# Patient Record
Sex: Male | Born: 1995 | Race: Black or African American | Hispanic: No | Marital: Single | Smoking: Current every day smoker
Health system: Southern US, Community
[De-identification: ages and names within clinical notes are randomized; demographics above are authoritative.]

## PROBLEM LIST (undated history)

## (undated) ENCOUNTER — Emergency Department (HOSPITAL_BASED_OUTPATIENT_CLINIC_OR_DEPARTMENT_OTHER): Discharge status: Absent without leave

---

## 2018-12-01 ENCOUNTER — Other Ambulatory Visit: Payer: Self-pay

## 2018-12-01 ENCOUNTER — Emergency Department (HOSPITAL_COMMUNITY)
Admission: EM | Admit: 2018-12-01 | Discharge: 2018-12-01 | Disposition: A | Discharge status: Home / Self Care | Payer: Self-pay | Attending: Emergency Medicine | Admitting: Emergency Medicine

## 2018-12-01 ENCOUNTER — Encounter (HOSPITAL_COMMUNITY): Payer: Self-pay | Admitting: Emergency Medicine

## 2018-12-01 DIAGNOSIS — F17200 Nicotine dependence, unspecified, uncomplicated: Secondary | ICD-10-CM | POA: Insufficient documentation

## 2018-12-01 DIAGNOSIS — R300 Dysuria: Secondary | ICD-10-CM | POA: Insufficient documentation

## 2018-12-01 DIAGNOSIS — Z202 Contact with and (suspected) exposure to infections with a predominantly sexual mode of transmission: Secondary | ICD-10-CM | POA: Insufficient documentation

## 2018-12-01 LAB — URINALYSIS, ROUTINE W REFLEX MICROSCOPIC
Bilirubin Urine: NEGATIVE
Glucose, UA: NEGATIVE mg/dL
Hgb urine dipstick: NEGATIVE
Ketones, ur: NEGATIVE mg/dL
Nitrite: NEGATIVE
Protein, ur: NEGATIVE mg/dL
Specific Gravity, Urine: 1.028 (ref 1.005–1.030)
pH: 6 (ref 5.0–8.0)

## 2018-12-01 MED ORDER — LIDOCAINE HCL (PF) 1 % IJ SOLN
INTRAMUSCULAR | Status: AC
  Administered 2018-12-01: 0.9 mL
  Filled 2018-12-01: qty 5

## 2018-12-01 MED ORDER — CEFTRIAXONE SODIUM 250 MG IJ SOLR
250.0000 mg | Freq: Once | INTRAMUSCULAR | Status: AC
  Administered 2018-12-01: 21:00:00 250 mg via INTRAMUSCULAR
  Filled 2018-12-01: qty 250

## 2018-12-01 MED ORDER — AZITHROMYCIN 250 MG PO TABS
1000.0000 mg | ORAL_TABLET | Freq: Once | ORAL | Status: AC
  Administered 2018-12-01: 1000 mg via ORAL
  Filled 2018-12-01: qty 4

## 2018-12-01 NOTE — Discharge Instructions (Signed)
You have cultures for gonorrhea and chlamydia that will return in 24-48 hours.

## 2018-12-01 NOTE — ED Provider Notes (Signed)
[facility] EMERGENCY DEPARTMENT Provider Note   CSN: [id number] Arrival date & time: 12/01/18  1802    History   Chief Complaint Chief Complaint  Patient presents with  . Dysuria  . Exposure to STD    HPI Cristian Rose is a 23 y.o. male.     The history is provided by the patient.  Dysuria  Presenting symptoms: dysuria   Context: after urination   Relieved by:  Nothing Ineffective treatments:  None tried Associated symptoms: no abdominal pain   Risk factors: no STI exposure   Exposure to STD  Pertinent negatives include no abdominal pain.    History reviewed. No pertinent past medical history.  There are no active problems to display for this patient.   History reviewed. No pertinent surgical history.      Home Medications    Prior to Admission medications   Not on File    Family History No family history on file.  Social History Social History   Tobacco Use  . Smoking status: Current Every Day Smoker    Packs/day: 0.50  . Smokeless tobacco: Never Used  Substance Use Topics  . Alcohol use: Yes  . Drug use: Never     Allergies   Patient has no allergy information on record.   Review of Systems Review of Systems  Gastrointestinal: Negative for abdominal pain.  Genitourinary: Positive for dysuria.  All other systems reviewed and are negative.    Physical Exam Updated Vital Signs BP 111/68 (BP Location: Left Arm)   Pulse 60   Temp 98.4 F (36.9 C) (Oral)   Resp 16   Wt 70.3 kg   SpO2 99%   Physical Exam Vitals signs and nursing note reviewed.  Constitutional:      Appearance: He is well-developed.  HENT:     Head: Normocephalic.  Neck:     Musculoskeletal: Normal range of motion.  Pulmonary:     Effort: Pulmonary effort is normal.  Abdominal:     General: There is no distension.  Musculoskeletal: Normal range of motion.  Neurological:     Mental Status: He is alert and oriented to person, place, and  time.      ED Treatments / Results  Labs (all labs ordered are listed, but only abnormal results are displayed) Labs Reviewed  URINALYSIS, ROUTINE W REFLEX MICROSCOPIC - Abnormal; Notable for the following components:      Result Value   Leukocytes,Ua MODERATE (*)    Bacteria, UA RARE (*)    All other components within normal limits  URINE CYTOLOGY ANCILLARY ONLY    EKG None  Radiology No results found.  Procedures Procedures (including critical care time)  Medications Ordered in ED Medications  cefTRIAXone (ROCEPHIN) injection 250 mg (250 mg Intramuscular Given 12/01/18 2122)  azithromycin (ZITHROMAX) tablet 1,000 mg (1,000 mg Oral Given 12/01/18 2123)  lidocaine (PF) (XYLOCAINE) 1 % injection (0.9 mLs  Given 12/01/18 2126)     Initial Impression / Assessment and Plan / ED Course  I have reviewed the triage vital signs and the nursing notes.  Pertinent labs & imaging results that were available during my care of the patient were reviewed by me and considered in my medical decision making (see chart for details).         MDM  GC and ct pending.  Pt request treatment.  Pt given Rocephin and zithromax Final Clinical Impressions(s) / ED Diagnoses   Final diagnoses:  Dysuria  ED Discharge Orders    None    An After Visit Summary was printed and given to the patient.    Osie CheeksSofia, Jahzeel Poythress K, PA-C 12/01/18 2225    Loren RacerYelverton, David, MD 12/06/18 [phone]

## 2018-12-01 NOTE — ED Triage Notes (Signed)
Pt in with c/o burning with urination since this am. Wants to get tested for STD's

## 2018-12-01 NOTE — ED Notes (Signed)
Called [facility] to find out what to do for cytology order. Sending lab rec to 12.

## 2018-12-02 LAB — URINE CYTOLOGY ANCILLARY ONLY
Chlamydia: NEGATIVE
Neisseria Gonorrhea: POSITIVE — AB

## 2019-03-21 ENCOUNTER — Emergency Department (HOSPITAL_COMMUNITY)
Admission: EM | Admit: 2019-03-21 | Discharge: 2019-03-21 | Disposition: A | Discharge status: Home / Self Care | Payer: Medicaid Other | Attending: Emergency Medicine | Admitting: Emergency Medicine

## 2019-03-21 ENCOUNTER — Other Ambulatory Visit: Payer: Self-pay

## 2019-03-21 DIAGNOSIS — Z5321 Procedure and treatment not carried out due to patient leaving prior to being seen by health care provider: Secondary | ICD-10-CM | POA: Insufficient documentation

## 2019-03-21 DIAGNOSIS — M549 Dorsalgia, unspecified: Secondary | ICD-10-CM | POA: Insufficient documentation
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
## 2019-05-23 ENCOUNTER — Encounter (HOSPITAL_COMMUNITY): Payer: Self-pay

## 2019-05-23 ENCOUNTER — Emergency Department (HOSPITAL_COMMUNITY)
Admission: EM | Admit: 2019-05-23 | Discharge: 2019-05-23 | Disposition: A | Discharge status: Home / Self Care | Payer: Medicaid Other | Attending: Emergency Medicine | Admitting: Emergency Medicine

## 2019-05-23 ENCOUNTER — Other Ambulatory Visit: Payer: Self-pay

## 2019-05-23 DIAGNOSIS — R1031 Right lower quadrant pain: Secondary | ICD-10-CM | POA: Insufficient documentation

## 2019-05-23 DIAGNOSIS — Z5321 Procedure and treatment not carried out due to patient leaving prior to being seen by health care provider: Secondary | ICD-10-CM | POA: Insufficient documentation

## 2019-05-23 LAB — CBC
HCT: 47.8 % (ref 39.0–52.0)
Hemoglobin: 15.2 g/dL (ref 13.0–17.0)
MCH: 27 pg (ref 26.0–34.0)
MCHC: 31.8 g/dL (ref 30.0–36.0)
MCV: 84.8 fL (ref 80.0–100.0)
Platelets: 248 10*3/uL (ref 150–400)
RBC: 5.64 MIL/uL (ref 4.22–5.81)
RDW: 13.6 % (ref 11.5–15.5)
WBC: 4.7 10*3/uL (ref 4.0–10.5)
nRBC: 0 % (ref 0.0–0.2)

## 2019-05-23 LAB — COMPREHENSIVE METABOLIC PANEL
ALT: 21 U/L (ref 0–44)
AST: 24 U/L (ref 15–41)
Albumin: 4.3 g/dL (ref 3.5–5.0)
Alkaline Phosphatase: 38 U/L (ref 38–126)
Anion gap: 9 (ref 5–15)
BUN: 12 mg/dL (ref 6–20)
CO2: 28 mmol/L (ref 22–32)
Calcium: 9.4 mg/dL (ref 8.9–10.3)
Chloride: 105 mmol/L (ref 98–111)
Creatinine, Ser: 1.13 mg/dL (ref 0.61–1.24)
GFR calc Af Amer: >60 mL/min (ref >60)
GFR calc non Af Amer: >60 mL/min (ref >60)
Glucose, Bld: 91 mg/dL (ref 70–99)
Potassium: 4.1 mmol/L (ref 3.5–5.1)
Sodium: 142 mmol/L (ref 135–145)
Total Bilirubin: 0.6 mg/dL (ref 0.3–1.2)
Total Protein: 7.2 g/dL (ref 6.5–8.1)

## 2019-05-23 LAB — URINALYSIS, ROUTINE W REFLEX MICROSCOPIC
Bilirubin Urine: NEGATIVE
Glucose, UA: NEGATIVE mg/dL
Hgb urine dipstick: NEGATIVE
Ketones, ur: NEGATIVE mg/dL
Leukocytes,Ua: NEGATIVE
Nitrite: NEGATIVE
Protein, ur: NEGATIVE mg/dL
Specific Gravity, Urine: 1.021 (ref 1.005–1.030)
pH: 6 (ref 5.0–8.0)

## 2019-05-23 LAB — LIPASE, BLOOD: Lipase: 24 U/L (ref 11–51)

## 2019-05-23 NOTE — ED Triage Notes (Signed)
Pt arrives POV for eval of R sided abd/flank pain onset 3 days PTA. Pt reports that his family recently all had their appendix out and he wants to get checked out for same. Denies fever, N/V.

## 2019-05-23 NOTE — ED Notes (Signed)
Pt said he's been "working all day, came here straight from work and is leaving to get some sleep." This RN and Programme researcher, broadcasting/film/video advised against leaving without being seen. Pt said he will return if need be.

## 2019-05-26 ENCOUNTER — Other Ambulatory Visit: Payer: Self-pay

## 2019-05-26 ENCOUNTER — Emergency Department (HOSPITAL_COMMUNITY)
Admission: EM | Admit: 2019-05-26 | Discharge: 2019-05-26 | Disposition: A | Discharge status: Home / Self Care | Payer: Medicaid Other | Attending: Emergency Medicine | Admitting: Emergency Medicine

## 2019-05-26 ENCOUNTER — Encounter (HOSPITAL_COMMUNITY): Payer: Self-pay

## 2019-05-26 DIAGNOSIS — R1031 Right lower quadrant pain: Secondary | ICD-10-CM | POA: Insufficient documentation

## 2019-05-26 DIAGNOSIS — F17210 Nicotine dependence, cigarettes, uncomplicated: Secondary | ICD-10-CM | POA: Insufficient documentation

## 2019-05-26 LAB — COMPREHENSIVE METABOLIC PANEL
ALT: 19 U/L (ref 0–44)
AST: 20 U/L (ref 15–41)
Albumin: 4.8 g/dL (ref 3.5–5.0)
Alkaline Phosphatase: 38 U/L (ref 38–126)
Anion gap: 8 (ref 5–15)
BUN: 15 mg/dL (ref 6–20)
CO2: 27 mmol/L (ref 22–32)
Calcium: 9.2 mg/dL (ref 8.9–10.3)
Chloride: 105 mmol/L (ref 98–111)
Creatinine, Ser: 0.99 mg/dL (ref 0.61–1.24)
GFR calc Af Amer: >60 mL/min (ref >60)
GFR calc non Af Amer: >60 mL/min (ref >60)
Glucose, Bld: 87 mg/dL (ref 70–99)
Potassium: 3.5 mmol/L (ref 3.5–5.1)
Sodium: 140 mmol/L (ref 135–145)
Total Bilirubin: 0.6 mg/dL (ref 0.3–1.2)
Total Protein: 7.9 g/dL (ref 6.5–8.1)

## 2019-05-26 LAB — CBC
HCT: 46.6 % (ref 39.0–52.0)
Hemoglobin: 14.6 g/dL (ref 13.0–17.0)
MCH: 26.4 pg (ref 26.0–34.0)
MCHC: 31.3 g/dL (ref 30.0–36.0)
MCV: 84.4 fL (ref 80.0–100.0)
Platelets: 263 10*3/uL (ref 150–400)
RBC: 5.52 MIL/uL (ref 4.22–5.81)
RDW: 13.2 % (ref 11.5–15.5)
WBC: 5.7 10*3/uL (ref 4.0–10.5)
nRBC: 0 % (ref 0.0–0.2)

## 2019-05-26 LAB — LIPASE, BLOOD: Lipase: 25 U/L (ref 11–51)

## 2019-05-26 LAB — URINALYSIS, ROUTINE W REFLEX MICROSCOPIC
Bilirubin Urine: NEGATIVE
Glucose, UA: NEGATIVE mg/dL
Hgb urine dipstick: NEGATIVE
Ketones, ur: NEGATIVE mg/dL
Leukocytes,Ua: NEGATIVE
Nitrite: NEGATIVE
Protein, ur: NEGATIVE mg/dL
Specific Gravity, Urine: 1.019 (ref 1.005–1.030)
pH: 7 (ref 5.0–8.0)

## 2019-05-26 MED ORDER — SODIUM CHLORIDE 0.9% FLUSH: 3.0000 mL | Freq: Once | INTRAVENOUS | Status: DC

## 2019-05-26 MED ORDER — DICYCLOMINE HCL 20 MG PO TABS: 20.0000 mg | ORAL_TABLET | Freq: Two times a day (BID) | ORAL | 0 refills | Status: AC | PRN

## 2019-05-26 NOTE — ED Notes (Addendum)
Pt reports RLQ pain 8/10 based on position of body. RLQ tender when pressed. Would like pain medication. Pain gradually started 05/21/19 and feels "like pressure". Lying down makes the pain better. Standing makes the pain worse. Pt took alieve one day and it did not help.

## 2019-05-26 NOTE — ED Triage Notes (Signed)
Patient reports right sided abdominal pain since Nov 24th. Patient reports the pain is constant, thinks it may be appendicitis. NAD

## 2019-05-26 NOTE — Discharge Instructions (Signed)
Your work-up in the emergency department today was reassuring.  We recommend continued use of Aleve or ibuprofen for abdominal pain or cramping.  You may take this with Bentyl as needed for persistent symptoms.  Follow-up with a primary care doctor.  You should return to the ED for worsening/concerning symptoms such as worsening, severe pain, fever over 101F, vomiting with inability to tolerate food or fluids.

## 2019-05-26 NOTE — ED Provider Notes (Signed)
[facility] DEPT Provider Note   CSN: [id number] Arrival date & time: 05/26/19  0043     History   Chief Complaint Chief Complaint  Patient presents with  . Abdominal Pain    HPI Cristian Rose is a 23 y.o. male.     23 year old male presents to the emergency department for right lower quadrant abdominal pain.  Symptoms began on Thanksgiving.  Pain was initially constant, but has become intermittent.  It is unrelieved with Aleve.  Reports that pain feels "like a pressure".  It will radiate towards his right low back at times.  Pain aggravated when standing, slightly relieved when lying down.  No associated fevers, vomiting, melena, hematochezia, diarrhea, urinary symptoms, scrotal swelling, testicular tenderness, history of abdominal surgeries.  The history is provided by the patient. No language interpreter was used.  Abdominal Pain   History reviewed. No pertinent past medical history.  There are no active problems to display for this patient.   History reviewed. No pertinent surgical history.      Home Medications    Prior to Admission medications   Medication Sig Start Date End Date Taking? Authorizing Provider  dicyclomine (BENTYL) 20 MG tablet Take 1 tablet (20 mg total) by mouth every 12 (twelve) hours as needed (for abdominal cramping/spasms). 05/26/19   Antonietta Breach, PA-C    Family History No family history on file.  Social History Social History   Tobacco Use  . Smoking status: Current Every Day Smoker    Packs/day: 0.25    Types: Cigarettes  . Smokeless tobacco: Never Used  Substance Use Topics  . Alcohol use: Yes  . Drug use: Never     Allergies   Patient has no known allergies.   Review of Systems Review of Systems  Gastrointestinal: Positive for abdominal pain.  Ten systems reviewed and are negative for acute change, except as noted in the HPI.    Physical Exam Updated Vital Signs BP 119/64   Pulse  (!) 54   Temp 98.8 F (37.1 C) (Oral)   Resp 18   Ht 5\' 7"  (1.702 m)   Wt 68 kg   SpO2 99%   BMI 23.49 kg/m   Physical Exam Vitals signs and nursing note reviewed.  Constitutional:      General: He is not in acute distress.    Appearance: He is well-developed. He is not diaphoretic.     Comments: Nontoxic appearing and in NAD  HENT:     Head: Normocephalic and atraumatic.  Eyes:     General: No scleral icterus.    Conjunctiva/sclera: Conjunctivae normal.  Neck:     Musculoskeletal: Normal range of motion.  Cardiovascular:     Rate and Rhythm: Normal rate and regular rhythm.     Pulses: Normal pulses.  Pulmonary:     Effort: Pulmonary effort is normal. No respiratory distress.     Comments: Respirations even and unlabored Abdominal:     Palpations: Abdomen is soft. There is no mass.     Tenderness: There is no guarding.     Comments: Soft, nontender, nondistended  Musculoskeletal: Normal range of motion.  Skin:    General: Skin is warm and dry.     Coloration: Skin is not pale.     Findings: No erythema or rash.  Neurological:     Mental Status: He is alert and oriented to person, place, and time.     Coordination: Coordination normal.  Psychiatric:  Behavior: Behavior normal.      ED Treatments / Results  Labs (all labs ordered are listed, but only abnormal results are displayed) Labs Reviewed  URINALYSIS, ROUTINE W REFLEX MICROSCOPIC - Abnormal; Notable for the following components:      Result Value   APPearance HAZY (*)    All other components within normal limits  LIPASE, BLOOD  COMPREHENSIVE METABOLIC PANEL  CBC    EKG None  Radiology No results found.  Procedures Procedures (including critical care time)  Medications Ordered in ED Medications  sodium chloride flush (NS) 0.9 % injection 3 mL (has no administration in time range)     Initial Impression / Assessment and Plan / ED Course  I have reviewed the triage vital signs and  the nursing notes.  Pertinent labs & imaging results that were available during my care of the patient were reviewed by me and considered in my medical decision making (see chart for details).        23 year old male presenting for 5 days of right lower quadrant abdominal pain.  Last bowel movement was yesterday and was normal.  He has not had any vomiting or fevers.  Expresses concern for appendicitis which I feel is unlikely given lack of fever, leukocytosis, chronicity of symptoms.  His symptoms have also trended from constant to intermittent.  Abdominal exam is benign.  No reproducible tenderness.  Reports some radiation of his pain to the back, but no hematuria or change in kidney function to suggest kidney stone.  Low suspicion for emergent cause of symptoms.  Do not feel further work-up or imaging is presently warranted.  Have encouraged continued use of NSAIDs.  We will also give short course of Bentyl for symptom management encouraged primary care follow-up with return if symptoms worsen.  Return precautions discussed and provided. Patient discharged in stable condition with no unaddressed concerns.   Final Clinical Impressions(s) / ED Diagnoses   Final diagnoses:  Right lower quadrant abdominal pain    ED Discharge Orders         Ordered    dicyclomine (BENTYL) 20 MG tablet  Every 12 hours PRN     05/26/19 0332           Antony Madura, PA-C 05/26/19 0349    Gilda Crease, MD 05/26/19 [phone]

## 2019-12-28 ENCOUNTER — Encounter (HOSPITAL_COMMUNITY): Payer: Self-pay | Admitting: *Deleted

## 2019-12-28 ENCOUNTER — Other Ambulatory Visit: Payer: Self-pay

## 2019-12-28 ENCOUNTER — Emergency Department (HOSPITAL_COMMUNITY)
Admission: EM | Admit: 2019-12-28 | Discharge: 2019-12-28 | Disposition: A | Discharge status: Home / Self Care | Payer: Self-pay | Attending: Emergency Medicine | Admitting: Emergency Medicine

## 2019-12-28 ENCOUNTER — Emergency Department (HOSPITAL_COMMUNITY): Payer: Self-pay

## 2019-12-28 ENCOUNTER — Emergency Department (HOSPITAL_COMMUNITY)
Admission: EM | Admit: 2019-12-28 | Discharge: 2019-12-29 | Disposition: A | Discharge status: Home / Self Care | Payer: Medicaid Other | Attending: Emergency Medicine | Admitting: Emergency Medicine

## 2019-12-28 DIAGNOSIS — F1910 Other psychoactive substance abuse, uncomplicated: Secondary | ICD-10-CM

## 2019-12-28 DIAGNOSIS — R4020 Unspecified coma: Secondary | ICD-10-CM | POA: Insufficient documentation

## 2019-12-28 DIAGNOSIS — R233 Spontaneous ecchymoses: Secondary | ICD-10-CM | POA: Insufficient documentation

## 2019-12-28 DIAGNOSIS — F17210 Nicotine dependence, cigarettes, uncomplicated: Secondary | ICD-10-CM | POA: Insufficient documentation

## 2019-12-28 DIAGNOSIS — X509XXA Other and unspecified overexertion or strenuous movements or postures, initial encounter: Secondary | ICD-10-CM | POA: Insufficient documentation

## 2019-12-28 DIAGNOSIS — Y929 Unspecified place or not applicable: Secondary | ICD-10-CM | POA: Insufficient documentation

## 2019-12-28 DIAGNOSIS — Y939 Activity, unspecified: Secondary | ICD-10-CM | POA: Insufficient documentation

## 2019-12-28 DIAGNOSIS — Z23 Encounter for immunization: Secondary | ICD-10-CM | POA: Insufficient documentation

## 2019-12-28 DIAGNOSIS — R58 Hemorrhage, not elsewhere classified: Secondary | ICD-10-CM

## 2019-12-28 DIAGNOSIS — M25571 Pain in right ankle and joints of right foot: Secondary | ICD-10-CM | POA: Insufficient documentation

## 2019-12-28 DIAGNOSIS — Y999 Unspecified external cause status: Secondary | ICD-10-CM | POA: Insufficient documentation

## 2019-12-28 DIAGNOSIS — R000 Tachycardia, unspecified: Secondary | ICD-10-CM | POA: Insufficient documentation

## 2019-12-28 DIAGNOSIS — S60512A Abrasion of left hand, initial encounter: Secondary | ICD-10-CM | POA: Insufficient documentation

## 2019-12-28 LAB — URINALYSIS, ROUTINE W REFLEX MICROSCOPIC
Bilirubin Urine: NEGATIVE
Glucose, UA: NEGATIVE mg/dL
Hgb urine dipstick: NEGATIVE
Ketones, ur: NEGATIVE mg/dL
Leukocytes,Ua: NEGATIVE
Nitrite: NEGATIVE
Protein, ur: NEGATIVE mg/dL
Specific Gravity, Urine: 1.018 (ref 1.005–1.030)
pH: 6 (ref 5.0–8.0)

## 2019-12-28 LAB — ETHANOL: Alcohol, Ethyl (B): 100 mg/dL — ABNORMAL HIGH (ref <10)

## 2019-12-28 LAB — ACETAMINOPHEN LEVEL: Acetaminophen (Tylenol), Serum: <10 ug/mL — ABNORMAL LOW (ref 10–30)

## 2019-12-28 LAB — COMPREHENSIVE METABOLIC PANEL
ALT: 18 U/L (ref 0–44)
AST: 26 U/L (ref 15–41)
Albumin: 4.9 g/dL (ref 3.5–5.0)
Alkaline Phosphatase: 38 U/L (ref 38–126)
Anion gap: 16 — ABNORMAL HIGH (ref 5–15)
BUN: 10 mg/dL (ref 6–20)
CO2: 18 mmol/L — ABNORMAL LOW (ref 22–32)
Calcium: 9 mg/dL (ref 8.9–10.3)
Chloride: 103 mmol/L (ref 98–111)
Creatinine, Ser: 1.18 mg/dL (ref 0.61–1.24)
GFR calc Af Amer: >60 mL/min (ref >60)
GFR calc non Af Amer: >60 mL/min (ref >60)
Glucose, Bld: 118 mg/dL — ABNORMAL HIGH (ref 70–99)
Potassium: 3.6 mmol/L (ref 3.5–5.1)
Sodium: 137 mmol/L (ref 135–145)
Total Bilirubin: 0.4 mg/dL (ref 0.3–1.2)
Total Protein: 8 g/dL (ref 6.5–8.1)

## 2019-12-28 LAB — RAPID URINE DRUG SCREEN, HOSP PERFORMED
Amphetamines: POSITIVE — AB
Barbiturates: NOT DETECTED
Benzodiazepines: NOT DETECTED
Cocaine: NOT DETECTED
Opiates: NOT DETECTED
Tetrahydrocannabinol: NOT DETECTED

## 2019-12-28 LAB — CBC WITH DIFFERENTIAL/PLATELET
Abs Immature Granulocytes: 0.04 10*3/uL (ref 0.00–0.07)
Basophils Absolute: 0 10*3/uL (ref 0.0–0.1)
Basophils Relative: 1 %
Eosinophils Absolute: 0.1 10*3/uL (ref 0.0–0.5)
Eosinophils Relative: 1 %
HCT: 48.4 % (ref 39.0–52.0)
Hemoglobin: 15.4 g/dL (ref 13.0–17.0)
Immature Granulocytes: 1 %
Lymphocytes Relative: 19 %
Lymphs Abs: 1.4 10*3/uL (ref 0.7–4.0)
MCH: 26.7 pg (ref 26.0–34.0)
MCHC: 31.8 g/dL (ref 30.0–36.0)
MCV: 84 fL (ref 80.0–100.0)
Monocytes Absolute: 0.4 10*3/uL (ref 0.1–1.0)
Monocytes Relative: 6 %
Neutro Abs: 5.3 10*3/uL (ref 1.7–7.7)
Neutrophils Relative %: 72 %
Platelets: 270 10*3/uL (ref 150–400)
RBC: 5.76 MIL/uL (ref 4.22–5.81)
RDW: 13.5 % (ref 11.5–15.5)
WBC: 7.2 10*3/uL (ref 4.0–10.5)
nRBC: 0 % (ref 0.0–0.2)

## 2019-12-28 LAB — SALICYLATE LEVEL: Salicylate Lvl: <7 mg/dL — ABNORMAL LOW (ref 7.0–30.0)

## 2019-12-28 MED ORDER — SODIUM CHLORIDE 0.9 % IV SOLN: INTRAVENOUS | Status: DC

## 2019-12-28 MED ORDER — TETANUS-DIPHTH-ACELL PERTUSSIS 5-2.5-18.5 LF-MCG/0.5 IM SUSP
0.5000 mL | Freq: Once | INTRAMUSCULAR | Status: DC
  Filled 2019-12-28: qty 0.5
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
## 2019-12-28 NOTE — ED Notes (Signed)
Pt trying to bite his IV out. MD made aware and verbal order to d/c the IV. No evidence of infiltration.
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
## 2019-12-28 NOTE — Discharge Instructions (Addendum)
You are medically cleared to go to jail.  Your labs are reassuring other than alcohol level of 100.  Your drug screen was positive for amphetamines.  X-rays of your right lower extremity showed no fracture or dislocation.
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
## 2019-12-29 MED ORDER — ACETAMINOPHEN 500 MG PO TABS
1000.0000 mg | ORAL_TABLET | Freq: Once | ORAL | Status: AC
  Administered 2019-12-29: 1000 mg via ORAL
  Filled 2019-12-29: qty 2

## 2019-12-29 NOTE — Discharge Instructions (Addendum)
Alternate tylenol and ibuprofen 
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
## 2022-02-27 IMAGING — DX DG TIBIA/FIBULA 2V*R*
2 series · 2 of 2 positions shown · non-contrast
Comparison: None.

CLINICAL DATA: Initial evaluation for acute right leg injury.

EXAM:
RIGHT TIBIA AND FIBULA - 2 VIEW

[tibia ap]
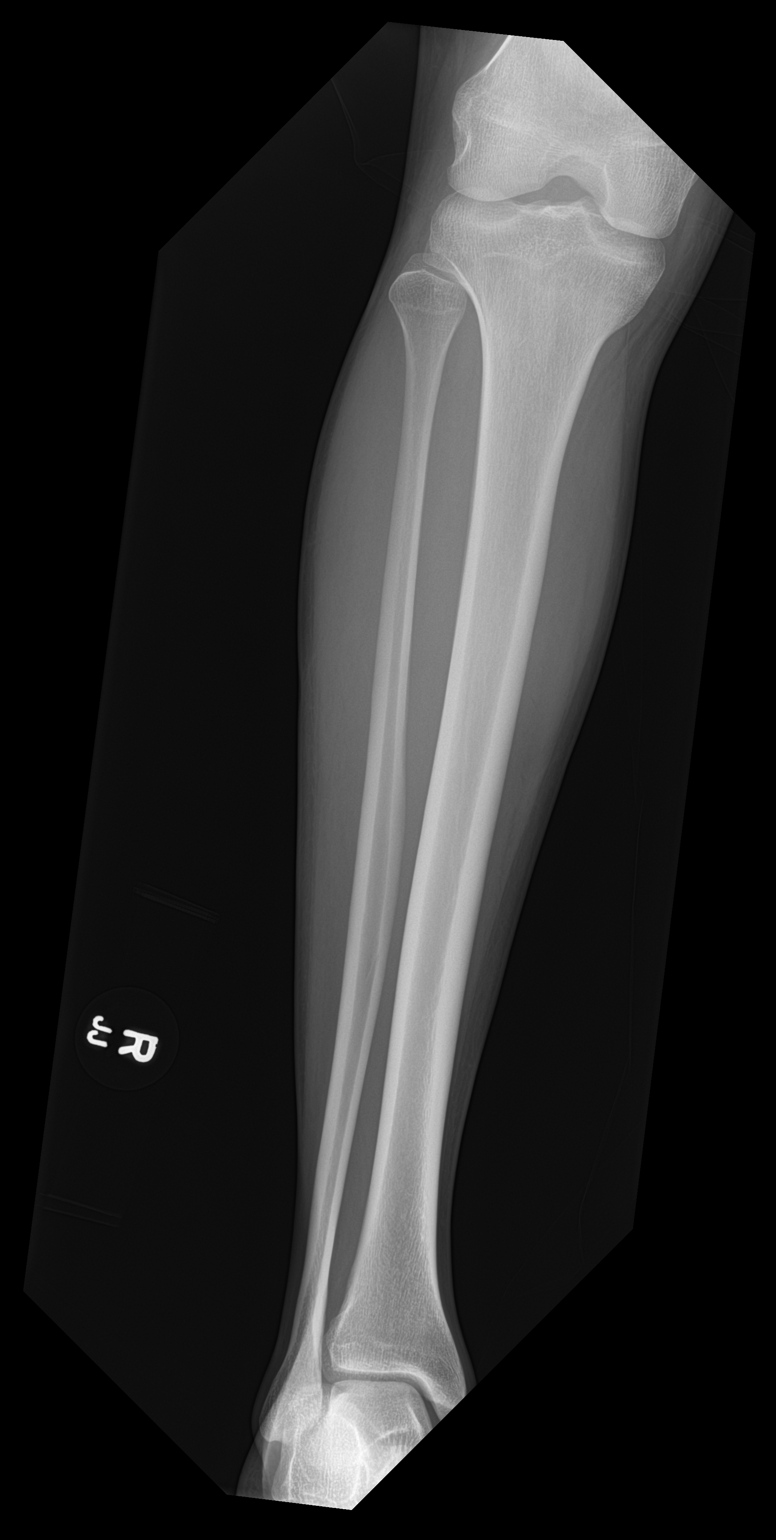

[tibia lat]
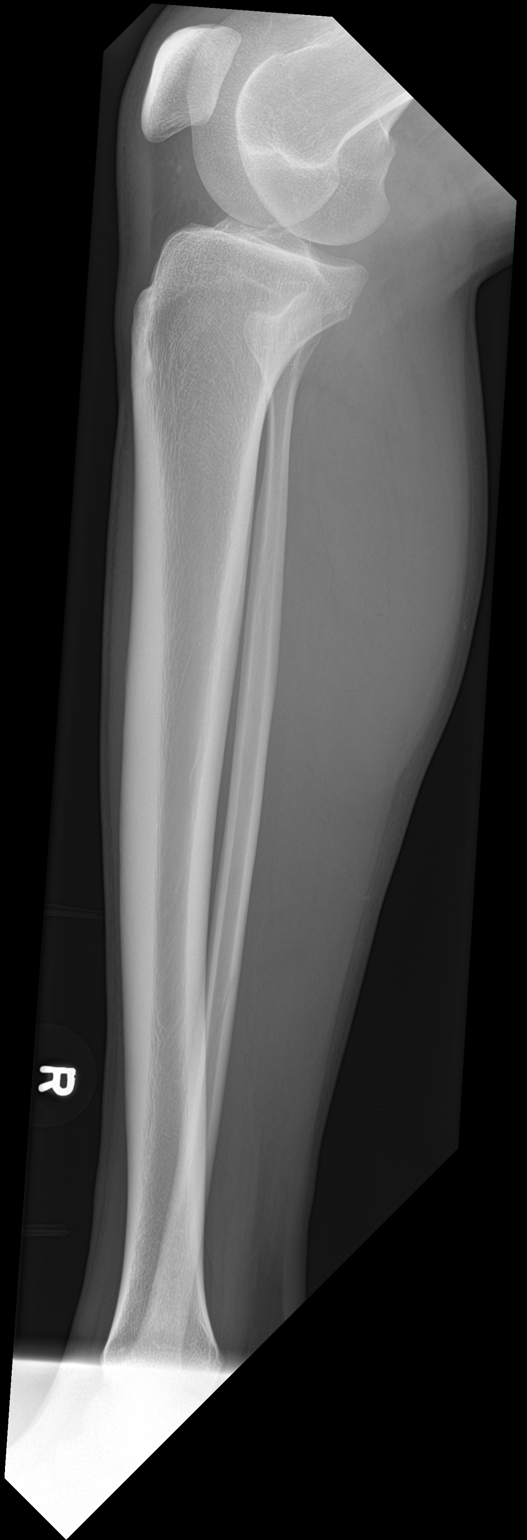

[2 of 2 positions shown; findings below may reference images not displayed]

FINDINGS: There is no evidence of fracture or other focal bone lesions. Soft
tissues are unremarkable.
IMPRESSION: Negative.
